# Patient Record
Sex: Female | Born: 1995 | Race: White | Hispanic: No | Marital: Single | Smoking: Never smoker
Health system: Southern US, Community
[De-identification: ages and names within clinical notes are randomized; demographics above are authoritative.]

## PROBLEM LIST (undated history)

## (undated) DIAGNOSIS — F909 Attention-deficit hyperactivity disorder, unspecified type: Secondary | ICD-10-CM

## (undated) DIAGNOSIS — R079 Chest pain, unspecified: Secondary | ICD-10-CM

## (undated) DIAGNOSIS — F32A Depression, unspecified: Secondary | ICD-10-CM

## (undated) DIAGNOSIS — G43909 Migraine, unspecified, not intractable, without status migrainosus: Secondary | ICD-10-CM

## (undated) HISTORY — DX: Chest pain, unspecified: R07.9

## (undated) HISTORY — DX: Migraine, unspecified, not intractable, without status migrainosus: G43.909

## (undated) HISTORY — DX: Depression, unspecified: F32.A

---
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
## 2020-02-08 LAB — HM PAP SMEAR: HM Pap smear: NEGATIVE

## 2020-04-14 ENCOUNTER — Encounter (HOSPITAL_BASED_OUTPATIENT_CLINIC_OR_DEPARTMENT_OTHER): Payer: Self-pay | Admitting: Emergency Medicine

## 2020-04-14 ENCOUNTER — Emergency Department (HOSPITAL_BASED_OUTPATIENT_CLINIC_OR_DEPARTMENT_OTHER): Payer: BC Managed Care – PPO

## 2020-04-14 ENCOUNTER — Other Ambulatory Visit: Payer: Self-pay

## 2020-04-14 ENCOUNTER — Emergency Department (HOSPITAL_BASED_OUTPATIENT_CLINIC_OR_DEPARTMENT_OTHER)
Admission: EM | Admit: 2020-04-14 | Discharge: 2020-04-14 | Disposition: A | Discharge status: Home / Self Care | Payer: BC Managed Care – PPO | Attending: Emergency Medicine | Admitting: Emergency Medicine

## 2020-04-14 DIAGNOSIS — G44319 Acute post-traumatic headache, not intractable: Secondary | ICD-10-CM | POA: Diagnosis not present

## 2020-04-14 DIAGNOSIS — R42 Dizziness and giddiness: Secondary | ICD-10-CM | POA: Diagnosis present

## 2020-04-14 LAB — CBC WITH DIFFERENTIAL/PLATELET
Abs Immature Granulocytes: 0.02 10*3/uL (ref 0.00–0.07)
Basophils Absolute: 0 10*3/uL (ref 0.0–0.1)
Basophils Relative: 0 %
Eosinophils Absolute: 0.1 10*3/uL (ref 0.0–0.5)
Eosinophils Relative: 2 %
HCT: 37.3 % (ref 36.0–46.0)
Hemoglobin: 12.3 g/dL (ref 12.0–15.0)
Immature Granulocytes: 0 %
Lymphocytes Relative: 24 %
Lymphs Abs: 1.4 10*3/uL (ref 0.7–4.0)
MCH: 28.3 pg (ref 26.0–34.0)
MCHC: 33 g/dL (ref 30.0–36.0)
MCV: 85.9 fL (ref 80.0–100.0)
Monocytes Absolute: 0.4 10*3/uL (ref 0.1–1.0)
Monocytes Relative: 7 %
Neutro Abs: 4 10*3/uL (ref 1.7–7.7)
Neutrophils Relative %: 67 %
Platelets: 214 10*3/uL (ref 150–400)
RBC: 4.34 MIL/uL (ref 3.87–5.11)
RDW: 13.5 % (ref 11.5–15.5)
WBC: 6 10*3/uL (ref 4.0–10.5)
nRBC: 0 % (ref 0.0–0.2)

## 2020-04-14 LAB — BASIC METABOLIC PANEL
Anion gap: 7 (ref 5–15)
BUN: 11 mg/dL (ref 6–20)
CO2: 24 mmol/L (ref 22–32)
Calcium: 8.7 mg/dL — ABNORMAL LOW (ref 8.9–10.3)
Chloride: 105 mmol/L (ref 98–111)
Creatinine, Ser: 0.78 mg/dL (ref 0.44–1.00)
GFR, Estimated: >60 mL/min (ref >60)
Glucose, Bld: 84 mg/dL (ref 70–99)
Potassium: 3.4 mmol/L — ABNORMAL LOW (ref 3.5–5.1)
Sodium: 136 mmol/L (ref 135–145)

## 2020-04-14 LAB — HCG, SERUM, QUALITATIVE: Preg, Serum: NEGATIVE

## 2020-04-14 MED ORDER — SODIUM CHLORIDE 0.9 % IV BOLUS
1000.0000 mL | Freq: Once | INTRAVENOUS | Status: AC
  Administered 2020-04-14: 1000 mL via INTRAVENOUS

## 2020-04-14 MED ORDER — MECLIZINE HCL 25 MG PO TABS
25.0000 mg | ORAL_TABLET | Freq: Once | ORAL | Status: AC
  Administered 2020-04-14: 25 mg via ORAL
  Filled 2020-04-14: qty 1

## 2020-04-14 MED ORDER — PROCHLORPERAZINE EDISYLATE 10 MG/2ML IJ SOLN
10.0000 mg | Freq: Once | INTRAMUSCULAR | Status: AC
  Administered 2020-04-14: 10 mg via INTRAVENOUS
  Filled 2020-04-14: qty 2

## 2020-04-14 MED ORDER — DIPHENHYDRAMINE HCL 50 MG/ML IJ SOLN
25.0000 mg | Freq: Once | INTRAMUSCULAR | Status: AC
  Administered 2020-04-14: 25 mg via INTRAVENOUS
  Filled 2020-04-14: qty 1

## 2020-04-14 NOTE — ED Provider Notes (Signed)
[facility] EMERGENCY DEPARTMENT Provider Note   CSN: [id number] Arrival date & time: 04/14/20  8828     History Chief Complaint  Patient presents with  . Dizziness    Kimberly Wiley is a 25 y.o. female.  54 yoF with a cc of a syncopal event.  Occurred two days ago.  Patient was at a concert, had had 1 alcoholic beverage and and suddenly felt lightheaded and collapsed to the ground.  She struck her head and her left hip.  She went home and woke up the next morning and had worsening pain to the hip and a headache that feels similar to her prior migraines.  She had persistent symptoms today and so went to urgent care who suggested she come to the ED for evaluation.  She has had some nausea with this.  Denies persistent vomiting.  Denies blood thinner use.  Denies one-sided numbness or weakness denies difficulty with speech or swallowing.  Is able to bear weight with the left leg but has pain with it.  The history is provided by the patient and a significant other.  Dizziness Quality:  Lightheadedness Severity:  Moderate Onset quality:  Gradual Duration:  2 days Timing:  Constant Progression:  Unchanged Chronicity:  New Context: standing up   Relieved by:  Nothing Worsened by:  Nothing Ineffective treatments:  None tried Associated symptoms: headaches   Associated symptoms: no chest pain, no nausea, no palpitations, no shortness of breath and no vomiting        Past Medical History:  Diagnosis Date  . ADHD (attention deficit hyperactivity disorder)     There are no problems to display for this patient.   History reviewed. No pertinent surgical history.   OB History   No obstetric history on file.     History reviewed. No pertinent family history.  Social History   Tobacco Use  . Smoking status: Never Smoker  . Smokeless tobacco: Never Used  . Tobacco comment: cbd  Substance Use Topics  . Alcohol use: Yes    Comment: once week socially  . Drug  use: No    Home Medications Prior to Admission medications   Medication Sig Start Date End Date Taking? Authorizing Provider  cephALEXin (KEFLEX) 500 MG capsule Take 1 capsule (500 mg total) by mouth 4 (four) times daily. 02/04/15   Linwood Dibbles, MD  promethazine (PHENERGAN) 25 MG tablet Take 1 tablet (25 mg total) by mouth every 6 (six) hours as needed for nausea or vomiting. 02/04/15   Linwood Dibbles, MD    Allergies    Tylenol [acetaminophen]  Review of Systems   Review of Systems  Constitutional: Negative for chills and fever.  HENT: Negative for congestion and rhinorrhea.   Eyes: Negative for redness and visual disturbance.  Respiratory: Negative for shortness of breath and wheezing.   Cardiovascular: Negative for chest pain and palpitations.  Gastrointestinal: Negative for nausea and vomiting.  Genitourinary: Negative for dysuria and urgency.  Musculoskeletal: Positive for arthralgias. Negative for myalgias.  Skin: Negative for pallor and wound.  Neurological: Positive for dizziness and headaches.    Physical Exam Updated Vital Signs BP 118/90   Pulse (!) 54   Temp 97.8 F (36.6 C) (Oral)   Resp 12   Ht 5\' 8"  (1.727 m)   Wt 58.5 kg   SpO2 100%   BMI 19.61 kg/m   Physical Exam Vitals and nursing note reviewed.  Constitutional:      General: She is  not in acute distress.    Appearance: She is well-developed. She is not diaphoretic.  HENT:     Head: Normocephalic and atraumatic.     Comments: Pain with palpation of the left temporal area.  No obvious bruising. Eyes:     Pupils: Pupils are equal, round, and reactive to light.  Cardiovascular:     Rate and Rhythm: Normal rate and regular rhythm.     Heart sounds: No murmur heard. No friction rub. No gallop.   Pulmonary:     Effort: Pulmonary effort is normal.     Breath sounds: No wheezing or rales.  Abdominal:     General: There is no distension.     Palpations: Abdomen is soft.     Tenderness: There is no  abdominal tenderness.  Musculoskeletal:        General: Tenderness present.     Cervical back: Normal range of motion and neck supple.     Comments: Mild tenderness about the ischium on the left.  No significant pain to the left hip full range of motion, no pain with internal or external rotation.  No pelvic instability.  No signs of trauma.  Skin:    General: Skin is warm and dry.  Neurological:     Mental Status: She is alert and oriented to person, place, and time.     Cranial Nerves: Cranial nerves are intact.     Sensory: Sensation is intact.     Motor: Motor function is intact.     Coordination: Coordination is intact.     Comments: Painful gait.  Otherwise benign neuro exam.  Psychiatric:        Behavior: Behavior normal.     ED Results / Procedures / Treatments   Labs (all labs ordered are listed, but only abnormal results are displayed) Labs Reviewed  BASIC METABOLIC PANEL - Abnormal; Notable for the following components:      Result Value   Potassium 3.4 (*)    Calcium 8.7 (*)    All other components within normal limits  CBC WITH DIFFERENTIAL/PLATELET  HCG, SERUM, QUALITATIVE    EKG EKG Interpretation  Date/Time:  Saturday April 14 2020 09:28:31 EDT Ventricular Rate:  66 PR Interval:  114 QRS Duration: 86 QT Interval:  417 QTC Calculation: 437 R Axis:   56 Text Interpretation: Sinus rhythm Borderline short PR interval no wpw, prolonged qt or brugada No significant change since last tracing Confirmed by Melene Plan [phone]) on 04/14/2020 9:44:03 AM   Radiology CT Head Wo Contrast  Result Date: 04/14/2020 CLINICAL DATA:  25 year old female with history of head trauma after a fall at a concert 2 days ago with injury to the left posterior head. Ataxia. EXAM: CT HEAD WITHOUT CONTRAST TECHNIQUE: Contiguous axial images were obtained from the base of the skull through the vertex without intravenous contrast. COMPARISON:  No priors. FINDINGS: Brain: No evidence of acute  infarction, hemorrhage, hydrocephalus, extra-axial collection or mass lesion/mass effect. Vascular: No hyperdense vessel or unexpected calcification. Skull: Normal. Negative for fracture or focal lesion. Sinuses/Orbits: No acute finding. Other: None. IMPRESSION: 1. No evidence of significant acute traumatic injury to the skull or brain. The appearance of the brain is normal. Electronically Signed   By: Trudie Reed M.D.   On: 04/14/2020 11:18    Procedures Procedures   Medications Ordered in ED Medications  meclizine (ANTIVERT) tablet 25 mg (25 mg Oral Given 04/14/20 0951)  sodium chloride 0.9 % bolus 1,000 mL (1,000 mLs  Intravenous New Bag/Given 04/14/20 1020)  prochlorperazine (COMPAZINE) injection 10 mg (10 mg Intravenous Given 04/14/20 1022)  diphenhydrAMINE (BENADRYL) injection 25 mg (25 mg Intravenous Given 04/14/20 1020)    ED Course  I have reviewed the triage vital signs and the nursing notes.  Pertinent labs & imaging results that were available during my care of the patient were reviewed by me and considered in my medical decision making (see chart for details).    MDM Rules/Calculators/A&P                          25 yo F with a chief complaint of a fall.  This occurred a couple days ago.  The patient was at a rock concerts and felt unwell and then collapsed to the ground.  Since then has been dizzy and having left-sided headaches.  She has a benign neurologic exam here.  I feel the patient is unlikely to have a significant intracranial hemorrhage if she is greater than 48 hours after the event, much more likely she has a concussion.  I discussed with her different possible evaluations including CT imaging and blood work.  Headache cocktail as this feels like her prior migraines which could have been activated by the trauma.  Agrees to blood work, reassess.     Blood work without anemia.  Mild hypokalemia otherwise no electrolyte abnormality.  CT scan of the head is negative for  acute intracranial pathology.  On reassessment the patient is feeling much better.  Will discharge home.  PCP follow-up.  11:29 AM:  I have discussed the diagnosis/risks/treatment options with the patient and believe the pt to be eligible for discharge home to follow-up with PCP. We also discussed returning to the ED immediately if new or worsening sx occur. We discussed the sx which are most concerning (e.g., sudden worsening pain, fever, inability to tolerate by mouth) that necessitate immediate return. Medications administered to the patient during their visit and any new prescriptions provided to the patient are listed below.  Medications given during this visit Medications  meclizine (ANTIVERT) tablet 25 mg (25 mg Oral Given 04/14/20 0951)  sodium chloride 0.9 % bolus 1,000 mL (1,000 mLs Intravenous New Bag/Given 04/14/20 1020)  prochlorperazine (COMPAZINE) injection 10 mg (10 mg Intravenous Given 04/14/20 1022)  diphenhydrAMINE (BENADRYL) injection 25 mg (25 mg Intravenous Given 04/14/20 1020)     The patient appears reasonably screen and/or stabilized for discharge and I doubt any other medical condition or other [facility] requiring further screening, evaluation, or treatment in the ED at this time prior to discharge.   Final Clinical Impression(s) / ED Diagnoses Final diagnoses:  Acute post-traumatic headache, not intractable    Rx / DC Orders ED Discharge Orders    None       Melene Plan, DO 04/14/20 1129

## 2020-04-14 NOTE — ED Notes (Signed)
Patient transported to CT 

## 2020-04-14 NOTE — Discharge Instructions (Signed)
Follow up with your doctor in the office.  Return for worsening headache or dizziness.

## 2020-04-14 NOTE — ED Triage Notes (Signed)
Pt passed out at event Thursday night, hit left hip and head. C/o headache,dizziness.

## 2022-06-17 IMAGING — CT CT HEAD W/O CM
3 series · 15 of 47 positions shown, 18 images · non-contrast
Comparison: No priors.

CLINICAL DATA: 24-year-old female with history of head trauma after
a fall at a concert 2 days ago with injury to the left posterior
head. Ataxia.

EXAM:
CT HEAD WITHOUT CONTRAST
TECHNIQUE: Contiguous axial images were obtained from the base of the skull
through the vertex without intravenous contrast.

[Series 2: head wo · axial · 0.43mm/px · z∈[-173,-48]mm · 9 of 30 slices shown, 12 images]
[im 3/30  brain]
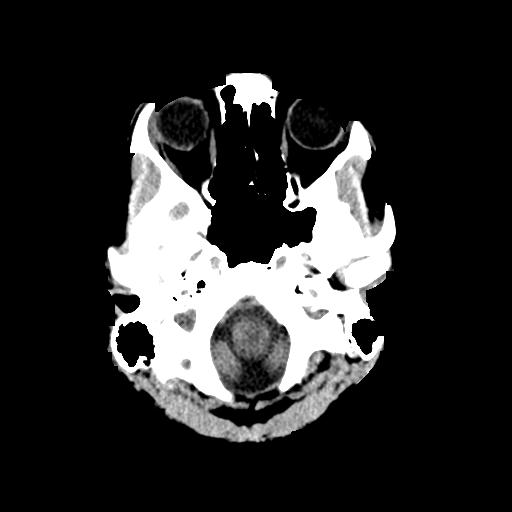
[im 3/30  bone]
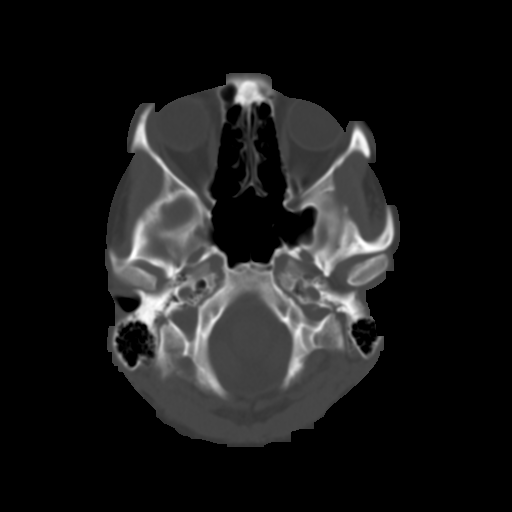
[im 6/30  brain]
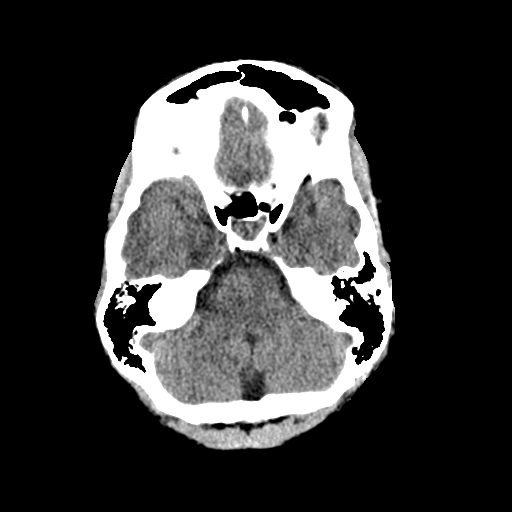
[im 9/30  brain]
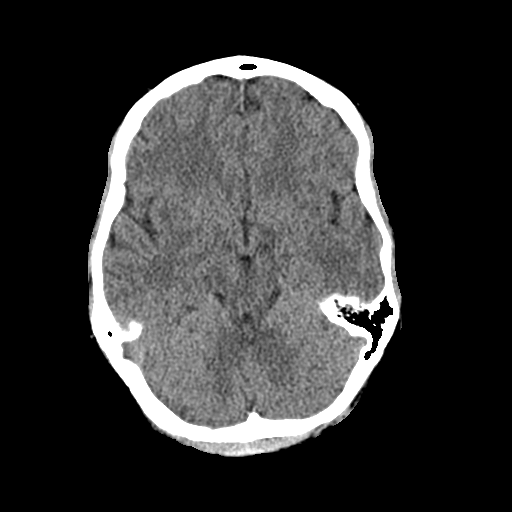
[im 12/30  brain]
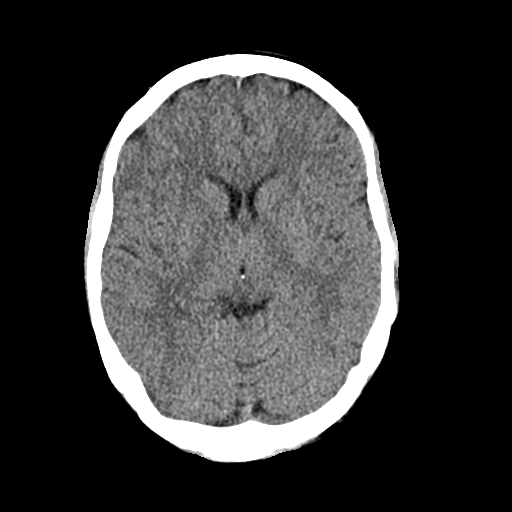
[im 16/30  brain]
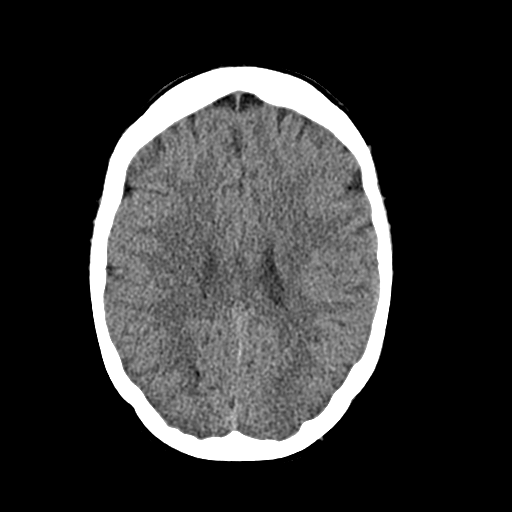
[im 16/30  bone]
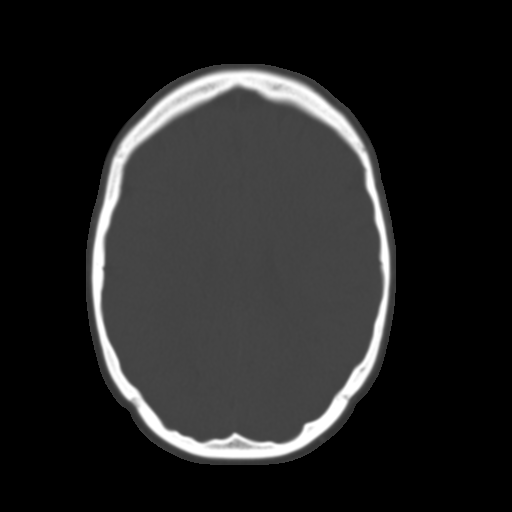
[im 19/30  brain]
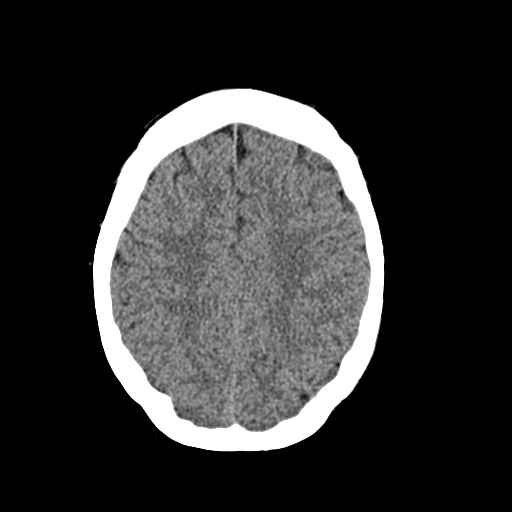
[im 22/30  brain]
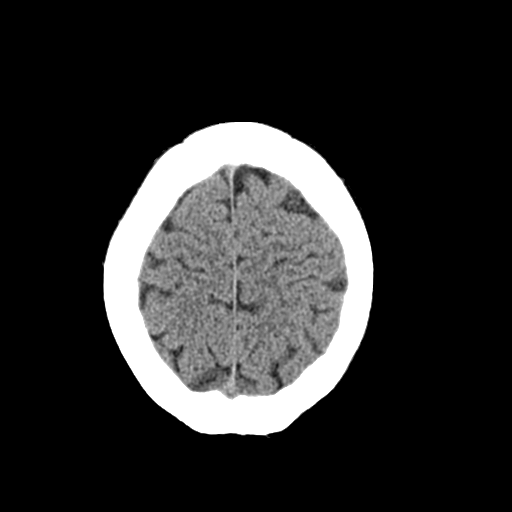
[im 25/30  brain]
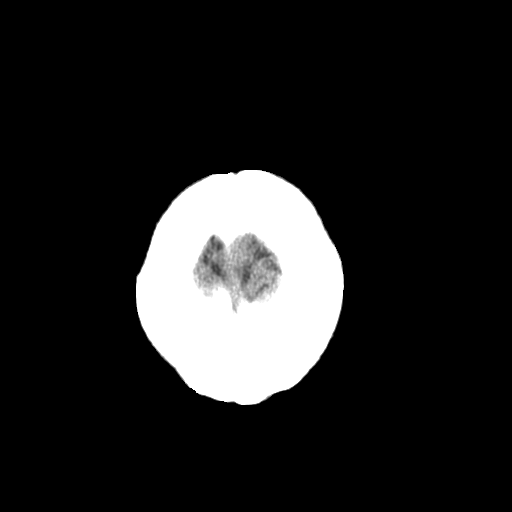
[im 28/30  brain]
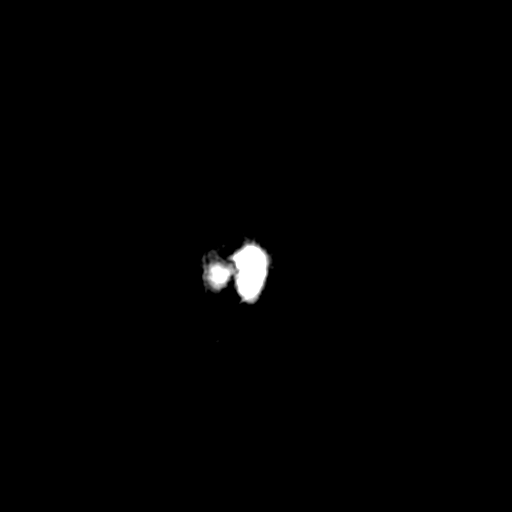
[im 28/30  bone]
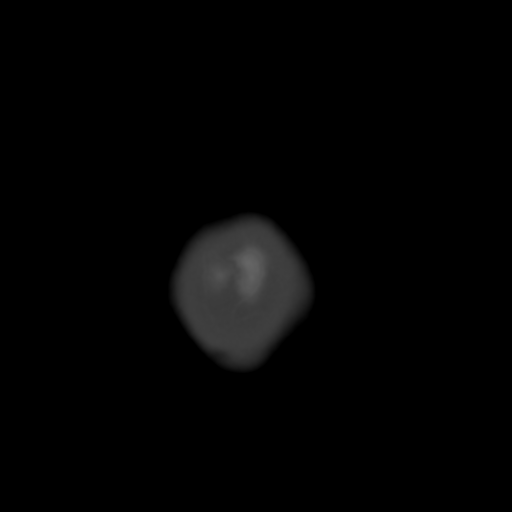

[Series 4: cor soft · coronal · 0.31mm/px · 3 of 68 slices shown]
[im 23/68  brain]
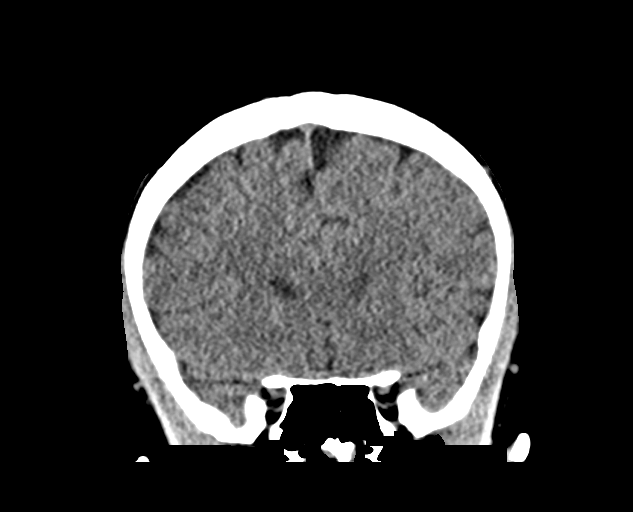
[im 30/68  brain]
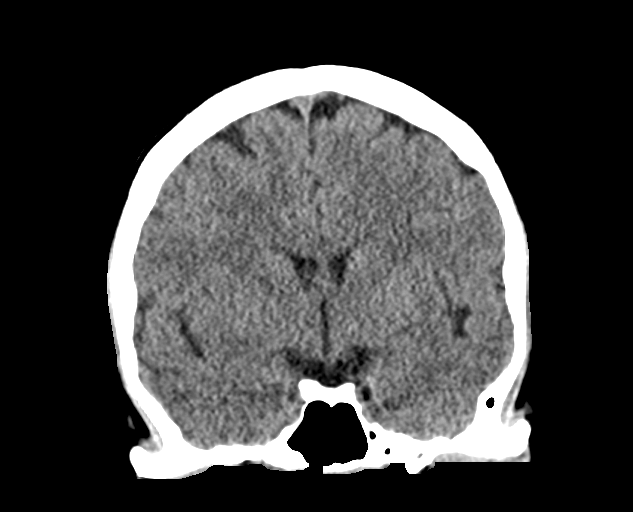
[im 38/68  brain]
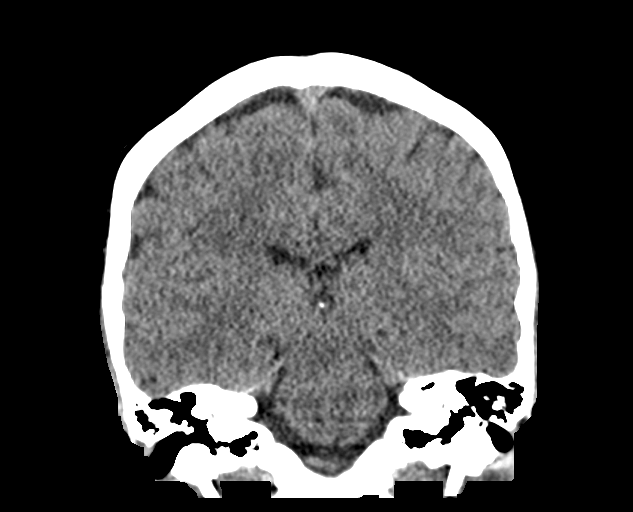

[Series 5: sag soft · sagittal · 0.31mm/px · 3 of 52 slices shown]
[im 18/52  brain]
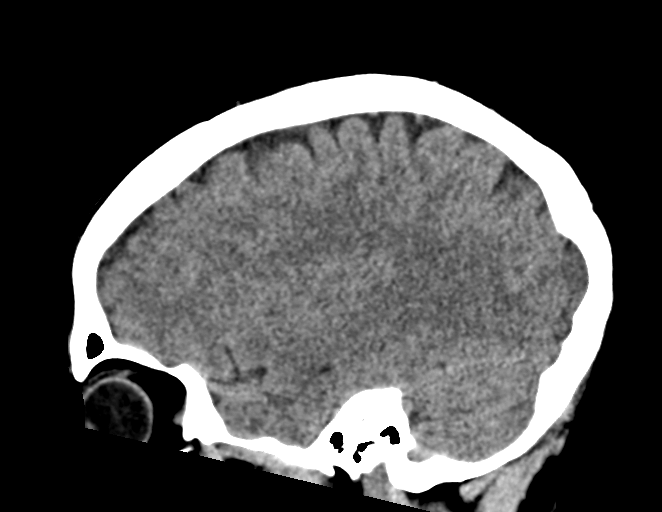
[im 26/52  brain]
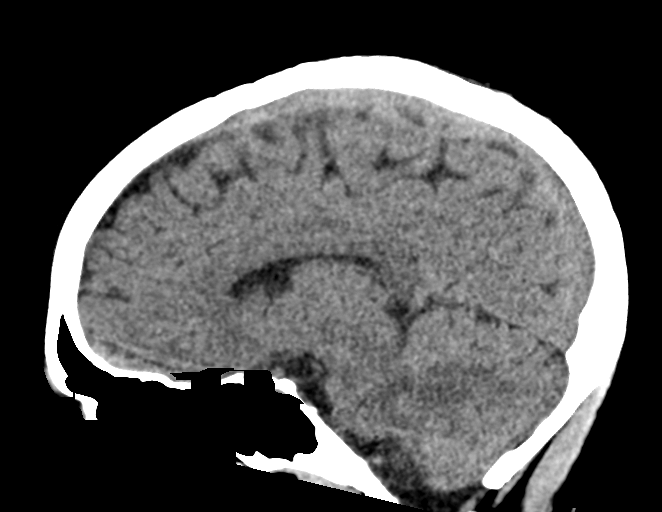
[im 35/52  brain]
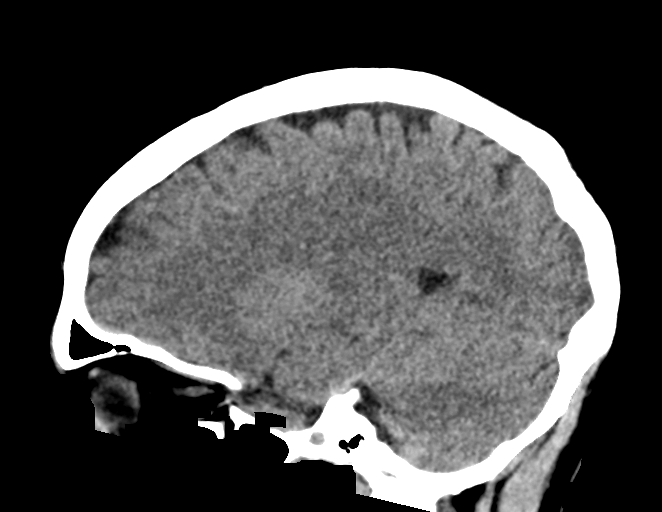

[15 of 47 positions shown; findings below may reference images not displayed]

FINDINGS: Brain: No evidence of acute infarction, hemorrhage, hydrocephalus,
extra-axial collection or mass lesion/mass effect.

Vascular: No hyperdense vessel or unexpected calcification.

Skull: Normal. Negative for fracture or focal lesion.

Sinuses/Orbits: No acute finding.

Other: None.
IMPRESSION: 1. No evidence of significant acute traumatic injury to the skull or
brain. The appearance of the brain is normal.

## 2023-02-20 ENCOUNTER — Emergency Department (HOSPITAL_COMMUNITY): Payer: Worker's Compensation

## 2023-02-20 ENCOUNTER — Encounter (HOSPITAL_COMMUNITY): Payer: Self-pay

## 2023-02-20 ENCOUNTER — Emergency Department (HOSPITAL_COMMUNITY)
Admission: EM | Admit: 2023-02-20 | Discharge: 2023-02-20 | Disposition: A | Discharge status: Home / Self Care | Payer: Worker's Compensation | Attending: Emergency Medicine | Admitting: Emergency Medicine

## 2023-02-20 ENCOUNTER — Other Ambulatory Visit: Payer: Self-pay

## 2023-02-20 DIAGNOSIS — W1840XA Slipping, tripping and stumbling without falling, unspecified, initial encounter: Secondary | ICD-10-CM | POA: Diagnosis not present

## 2023-02-20 DIAGNOSIS — Y990 Civilian activity done for income or pay: Secondary | ICD-10-CM | POA: Diagnosis not present

## 2023-02-20 DIAGNOSIS — S99911A Unspecified injury of right ankle, initial encounter: Secondary | ICD-10-CM | POA: Diagnosis present

## 2023-02-20 DIAGNOSIS — S93401A Sprain of unspecified ligament of right ankle, initial encounter: Secondary | ICD-10-CM | POA: Insufficient documentation

## 2023-02-20 NOTE — ED Provider Notes (Signed)
[place] EMERGENCY DEPARTMENT AT [facility] Provider Note   CSN: [id number] Arrival date & time: 02/20/23  2127     History  Chief Complaint  Patient presents with   Ankle Pain    Kimberly Wiley is a 28 y.o. female history of ADHD presented for right ankle pain after twisting her ankle at work.  Patient slipped on water and twisted her ankle.  Patient denies hitting her head or neck but states that her right ankle does hurt to move.  Patient be able to walk however it hurts to bear weight.  Patient able to move toes and denies any deformities or skin color changes.  Patient not take any medications for this.  Home Medications Prior to Admission medications   Medication Sig Start Date End Date Taking? Authorizing Provider  cephALEXin (KEFLEX) 500 MG capsule Take 1 capsule (500 mg total) by mouth 4 (four) times daily. 02/04/15   Linwood Dibbles, MD  promethazine (PHENERGAN) 25 MG tablet Take 1 tablet (25 mg total) by mouth every 6 (six) hours as needed for nausea or vomiting. 02/04/15   Linwood Dibbles, MD      Allergies    Tylenol [acetaminophen]    Review of Systems   Review of Systems  Physical Exam Updated Vital Signs BP 115/79 (BP Location: Left Arm)   Pulse 69   Temp 97.9 F (36.6 C) (Oral)   Resp 16   Ht 5\' 8"  (1.727 m)   Wt 58.5 kg   SpO2 100%   BMI 19.61 kg/m  Physical Exam Vitals reviewed.  Constitutional:      General: She is not in acute distress. Cardiovascular:     Rate and Rhythm: Normal rate.     Pulses: Normal pulses.  Musculoskeletal:     Comments: Right ankle: Negative anterior drawer test, limited range of motion due to pain, no bony tenderness abnormalities palpated, Achilles intact Able to bear weight Pain not out of proportion Soft compartments  Skin:    General: Skin is warm and dry.     Capillary Refill: Capillary refill takes less than 2 seconds.  Neurological:     Mental Status: She is alert.     Comments: Sensation intact  distally  Psychiatric:        Mood and Affect: Mood normal.     ED Results / Procedures / Treatments   Labs (all labs ordered are listed, but only abnormal results are displayed) Labs Reviewed - No data to display  EKG None  Radiology DG Ankle Complete Right Result Date: 02/20/2023 CLINICAL DATA:  fall EXAM: RIGHT ANKLE - COMPLETE 3+ VIEW COMPARISON:  None Available. FINDINGS: There is no evidence of fracture, dislocation, or joint effusion. There is no evidence of arthropathy or other focal bone abnormality. Soft tissues are unremarkable. IMPRESSION: Negative. Electronically Signed   By: Tish Frederickson M.D.   On: 02/20/2023 22:51    Procedures Procedures    Medications Ordered in ED Medications - No data to display  ED Course/ Medical Decision Making/ A&P                                 Medical Decision Making Amount and/or Complexity of Data Reviewed Radiology: ordered.   [name] 28 y.o. presented today for right ankle pain. Working DDx that I considered at this time includes, but not limited to, contusion, strain/sprain, fracture, dislocation, neurovascular compromise, septic joint,  ischemic limb, compartment syndrome.  R/o DDx: fracture, dislocation, neurovascular compromise, septic joint, ischemic limb, compartment syndrome: These are considered less likely due to history of present illness, physical exam, labs/imaging findings.  Review of prior external notes: 04/14/2020 ED  Unique Tests and My Independent Interpretation:  Right ankle x-ray: No acute pathology  Social Determinants of Health: none  Discussion with Independent Historian:  Husband  Discussion of Management of Tests: None  Risk: Low: based on diagnostic testing/clinical impression and treatment plan  Risk Stratification Score: None  Plan: On exam patient was no acute distress stable vitals.  Patient's exam was ultimately reassuring she is neuro vastly intact.  Patient was able to  bear weight as well.  I offered an ankle brace/boot and crutches however patient declined and states she wants to get this at [facility] and follow-up with her primary care provider which is reasonable.  I offered Tylenol ibuprofen and patient declined she states she has this at home.  Will discharge with work note.  Patient was given return precautions. Patient stable for discharge at this time.  Patient verbalized understanding of plan.  This chart was dictated using voice recognition software.  Despite best efforts to proofread,  errors can occur which can change the documentation meaning.         Final Clinical Impression(s) / ED Diagnoses Final diagnoses:  Sprain of right ankle, unspecified ligament, initial encounter    Rx / DC Orders ED Discharge Orders     None         Remi Deter 02/20/23 2332    Benjiman Core, MD 02/20/23 [phone]

## 2023-02-20 NOTE — ED Triage Notes (Signed)
Pt reports with right ankle pain after a fall today at work.

## 2023-02-20 NOTE — Discharge Instructions (Signed)
Please follow-up with your primary care provider in regards to recent ER visit. Today physical exam and imaging was reassuring and you most likely have a benign process going on. You may rest, ice, compress, elevate your extremity and use Tylenol or ibuprofen every 6 hours as needed for pain. If you begin to have decreased sensation, skin color changes, pain out of proportion/not controlled by over-the-counter medications, rigid compartments, or other worsening of symptoms please return to ER.

## 2023-07-14 DIAGNOSIS — F4322 Adjustment disorder with anxiety: Secondary | ICD-10-CM | POA: Diagnosis not present

## 2023-07-28 DIAGNOSIS — F4322 Adjustment disorder with anxiety: Secondary | ICD-10-CM | POA: Diagnosis not present

## 2023-08-11 DIAGNOSIS — F4322 Adjustment disorder with anxiety: Secondary | ICD-10-CM | POA: Diagnosis not present

## 2023-09-03 ENCOUNTER — Ambulatory Visit: Admitting: Family Medicine

## 2023-09-08 ENCOUNTER — Ambulatory Visit: Admitting: Family Medicine

## 2023-09-09 DIAGNOSIS — F4322 Adjustment disorder with anxiety: Secondary | ICD-10-CM | POA: Diagnosis not present

## 2023-09-10 ENCOUNTER — Ambulatory Visit: Attending: Family Medicine

## 2023-09-10 ENCOUNTER — Encounter: Payer: Self-pay | Admitting: Family Medicine

## 2023-09-10 ENCOUNTER — Ambulatory Visit: Admitting: Family Medicine

## 2023-09-10 VITALS — BP 108/72 | HR 69 | Temp 98.0°F | Ht 68.0 in | Wt 135.0 lb

## 2023-09-10 DIAGNOSIS — R079 Chest pain, unspecified: Secondary | ICD-10-CM

## 2023-09-10 DIAGNOSIS — Z7689 Persons encountering health services in other specified circumstances: Secondary | ICD-10-CM

## 2023-09-10 DIAGNOSIS — Z6820 Body mass index (BMI) 20.0-20.9, adult: Secondary | ICD-10-CM | POA: Diagnosis not present

## 2023-09-10 DIAGNOSIS — R55 Syncope and collapse: Secondary | ICD-10-CM

## 2023-09-10 DIAGNOSIS — F909 Attention-deficit hyperactivity disorder, unspecified type: Secondary | ICD-10-CM | POA: Insufficient documentation

## 2023-09-10 NOTE — Progress Notes (Unsigned)
 EP to read.

## 2023-09-10 NOTE — Progress Notes (Signed)
 New Patient Office Visit  Subjective   Patient ID: Kimberly Wiley, female    DOB: 1995-07-05  Age: 28 y.o. MRN: [id number]  CC:  Chief Complaint  Patient presents with   Establish Care    HPI [name] presents to establish care with new provider.  Patients previous primary care provider: [facility]-last seen in June 2024.   Specialist: [facility] for counseling  [facility]  Patient is concerned about having several symptoms that could be POTS or dysautonomia. She has had the following symptoms: syncopal episodes, unregulated temperature control, pain that can cause her to pass out, insomnia, urinary urgency, and eating urgency. Use to have syncopal episodes occur multiple times a day, several times a week. But, reports she has learned her triggers and will help to prevent them. Will try to drink electrolyte fluids or change temperature when triggers occur. Started 2 years ago. Last syncopal episode was 1 week ago.   She reports sometimes she will get a pin point mid sternal chest pain that last between seconds to minutes. Sharp, jabbing pain. Does not radiate. Last occurred 1 weeks ago. Also, will have SHOB that does not usually occur with the chest pain. Headaches usually occur twice a week. Thinks she has palpitations with change in heart rate based on her watch.   Based on chart review after visit, she was seen at [facility] for syncope and seizure like activity. She was referred to neurology. No neurology appointment is in Epic. Recommend to have basic lab work, but declined due to scared of needles. This also occurred today. She reports she was not prepared to have labs drawn.   Outpatient Encounter Medications as of 09/10/2023  Medication Sig   norelgestromin-ethinyl estradiol (XULANE) 150-35 MCG/24HR transdermal patch Place 1 patch onto the skin once a week.   [DISCONTINUED] cephALEXin   (KEFLEX ) 500 MG capsule Take 1 capsule (500 mg total) by mouth 4 (four) times daily.   [DISCONTINUED] promethazine  (PHENERGAN ) 25 MG tablet Take 1 tablet (25 mg total) by mouth every 6 (six) hours as needed for nausea or vomiting.   No facility-administered encounter medications on file as of 09/10/2023.    Past Medical History:  Diagnosis Date   ADHD (attention deficit hyperactivity disorder)    Depression    Migraines     History reviewed. No pertinent surgical history.  Family History  Problem Relation Age of Onset   Miscarriages / [place] Mother    Other Sister        dysautonomia   Other Maternal Grandmother        POTS    Social History   Socioeconomic History   Marital status: Single    Spouse name: Not on file   Number of children: 0   Years of education: Not on file   Highest education level: Associate degree: occupational, Scientist, product/process development, or vocational program  Occupational History   Not on file  Tobacco Use   Smoking status: Never   Smokeless tobacco: Never   Tobacco comments:    cbd  Vaping Use   Vaping status: Never Used  Substance and Sexual Activity   Alcohol use: Yes    Comment: once week socially   Drug use: No   Sexual activity: Yes    Birth control/protection: Patch  Other Topics Concern   Not on file  Social History Narrative   Not on file   Social Drivers of Health  Financial Resource Strain: Low Risk  (06/11/2022)   [facility]   Overall Financial Resource Strain (CARDIA)    Difficulty of Paying Living Expenses: Not very hard  Food Insecurity: No Food Insecurity (06/11/2022)   Received from [facility]   Hunger Vital Sign    Within the past 12 months, you worried that your food would run out before you got the money to buy more.: Never true    Within the past 12 months, the food you bought just didn't last and you didn't have money to get more.: Never true  Transportation Needs: No Transportation Needs (06/11/2022)    Received from [facility] - Transportation    Lack of Transportation (Medical): No    Lack of Transportation (Non-Medical): No  Physical Activity: Sufficiently Active (06/11/2022)   Received from [facility]   Exercise Vital Sign    On average, how many days per week do you engage in moderate to strenuous exercise (like a brisk walk)?: 3 days    On average, how many minutes do you engage in exercise at this level?: 70 min  Stress: Stress Concern Present (06/11/2022)   Received from [facility] Occupational [facility] - Occupational Stress Questionnaire    Feeling of Stress : Rather much  Social Connections: Socially Integrated (06/11/2022)   Received from [facility]   Social Network    How would you rate your social network (family, work, friends)?: Good participation with social networks  Intimate Partner Violence: Not At Risk (06/11/2022)   Received from [facility]   HITS    Over the last 12 months how often did your partner physically hurt you?: Never    Over the last 12 months how often did your partner insult you or talk down to you?: Never    Over the last 12 months how often did your partner threaten you with physical harm?: Never    Over the last 12 months how often did your partner scream or curse at you?: Never    ROS See HPI above    Objective  BP 108/72   Pulse 69   Temp 98 F (36.7 C) (Oral)   Ht 5' 8 (1.727 m)   Wt 135 lb (61.2 kg)   LMP 08/16/2023 (Approximate)   SpO2 96%   BMI 20.53 kg/m   Physical Exam Vitals reviewed.  Constitutional:      General: She is not in acute distress.    Appearance: Normal appearance. She is normal weight. She is not ill-appearing, toxic-appearing or diaphoretic.  HENT:     Head: Atraumatic.  Eyes:     General:        Right eye: No discharge.        Left eye: No discharge.     Extraocular Movements:     Right eye: Normal extraocular motion and no nystagmus.     Left eye: Normal  extraocular motion and no nystagmus.     Conjunctiva/sclera: Conjunctivae normal.     Pupils: Pupils are equal, round, and reactive to light.  Cardiovascular:     Rate and Rhythm: Normal rate and regular rhythm.     Heart sounds: Normal heart sounds. No murmur heard.    No friction rub. No gallop.  Pulmonary:     Effort: Pulmonary effort is normal. No respiratory distress.     Breath sounds: Normal breath sounds.  Musculoskeletal:        General: Normal  range of motion.     Right lower leg: No edema.     Left lower leg: No edema.  Skin:    General: Skin is warm and dry.  Neurological:     General: No focal deficit present.     Mental Status: She is alert and oriented to person, place, and time. Mental status is at baseline.     Cranial Nerves: Cranial nerves 2-12 are intact.     Motor: No weakness.  Psychiatric:        Mood and Affect: Mood is anxious.        Behavior: Behavior normal.        Thought Content: Thought content normal.        Judgment: Judgment normal.   EKG for syncopal episodes and chest pain. Sinus brady, HR 58 with no ST elevation. No acute changes from previous EKG on 04/16/2020.     Assessment & Plan:  Chest pain, unspecified type -     EKG 12-Lead -     CBC with Differential/Platelet; Future -     Comprehensive metabolic panel with GFR; Future -     Lipid panel; Future -     Magnesium; Future -     TSH; Future -     LONG TERM MONITOR (3-14 DAYS); Future -     Ambulatory referral to Cardiology  BMI 20.0-20.9, adult -     CBC with Differential/Platelet; Future -     Comprehensive metabolic panel with GFR; Future -     Lipid panel; Future -     Magnesium; Future -     TSH; Future  Syncope, unspecified syncope type -     EKG 12-Lead -     LONG TERM MONITOR (3-14 DAYS); Future -     Ambulatory referral to Cardiology  Encounter to establish care  1.Review health maintenance:  -Cervical cancer screening: 1-2 years ago; has an appointment in 1  month; will request records -Covid booster: Declines  -Tdap vaccine: Declines  -HIV and Hep C screening: Declines -Influenza vaccine: Declines  2.Ordered labs (CBC, CMP, Lipid panel, Magnesium, and TSH) . Please schedule an appointment when prepared to have labs drawn and able to fast. Office will call with lab results and will be available via MyChart.  3.EKG completed today for syncopal episode and chest. No acute findings.  4.Ordered portable heart monitor for chest pain and syncopal episode. Discussed about the process of the monitor.  5.Placed a referral to cardiology for chest pain and syncopal episode. 6.Follow up in 3-4 weeks to discuss further concerns.  Return for follow-up 3-4 weeks; lab appointment .   Jirah Rider, NP

## 2023-09-10 NOTE — Patient Instructions (Signed)
-  It was nice to meet you and look forward to taking care of you.  -Ordered labs. Please schedule an appointment when prepared and able to fast. Office will call with lab results and will be available via MyChart.  -EKG completed today for syncopal episode and chest. No acute findings.  -Ordered portable heart monitor for chest pain and syncopal episode. Discussed about the process of the monitor.  -Placed a referral to cardiology for chest pain and syncopal episode. Please call the office or send a MyChart message if you do not receive a phone call or a MyChart message about appointment in 2 weeks.  -Follow up in 3-4 weeks to discuss further concerns.

## 2023-09-14 NOTE — Progress Notes (Unsigned)
 OFFICE NOTE:    Date:  09/15/2023  ID:  Kimberly Wiley, DOB 08/19/1995, MRN [id number] PCP: Kimberly Philippe SAUNDERS, NP  [facility] Providers Cardiologist:  None        Syncope  ADHD Depression Migraine HAs        Discussed the use of AI scribe software for clinical note transcription with the patient, who gave verbal consent to proceed. History of Present Illness Kimberly Wiley is a 28 y.o. female who is referred by Kimberly Philippe SAUNDERS, NP for evaluation of chest pain, syncope. Her PCP ordered an event monitor. Results are pending. Of note, pt was seen at [facility] in 06/2022 for syncope and seizure-like activity. Pt was referred to Neurology but was never seen by Neuro.    She has experienced worsening palpitations for the past two years, describing them as episodes where she 'almost can't breathe.' Her heart rate fluctuates significantly, ranging from 39 to 210 beats per minute. These episodes last from 30 seconds to five minutes and are sometimes accompanied by a sensation of her heart skipping beats. She has had multiple episodes of syncope, with four to five occurrences in the past year. These episodes occur in various positions, including sitting, standing, and lying down, and are not consistently triggered by specific activities or stressors. She recalls an episode where she felt an intense heat sensation before passing out while on a bus in the [place]. She exercises regularly. She sometimes experiences chest pain during exercise, which can be dull and tapping, and has occasionally had to stop exercising due to the intensity of the pain.   FHx: Her family history includes a sister diagnosed with dysautonomia and a mother suspected of having dysautonomia or POTS. There is also a history of heart attacks among older family members.  SHx: She consumes alcohol infrequently, about once a month, and has never smoked or used drugs.    ROS-See HPI She reports a  cough as she is getting over a cold. No fever, chills, or difficulty breathing when lying flat.    Studies Reviewed:      EKG dated 10-01-2023 personally reviewed and interpreted by me today 09/15/2023  Sinus brady, HR 58, QTc 403, no acute ST-TW changes, no pre-excitation  Results         Physical Exam:  VS:  BP 118/76 (BP Location: Left Arm, Patient Position: Sitting)   Pulse 80   Ht 5' 8 (1.727 m)   Wt 134 lb 12.8 oz (61.1 kg)   LMP 08/16/2023 (Approximate)   SpO2 99%   BMI 20.50 kg/m     Orthostatic VS for the past 24 hrs (Last 3 readings):  BP- Lying Pulse- Lying BP- Sitting Pulse- Sitting BP- Standing at 0 minutes Pulse- Standing at 0 minutes BP- Standing at 3 minutes Pulse- Standing at 3 minutes  09/15/23 0852 111/73 79 117/82 77 116/80 81 116/82 87    Wt Readings from Last 3 Encounters:  09/15/23 134 lb 12.8 oz (61.1 kg)  10-01-2023 135 lb (61.2 kg)  02/20/23 128 lb 15.5 oz (58.5 kg)    Constitutional:      Appearance: Healthy appearance. Not in distress.  Neck:     Vascular: No carotid bruit. JVD normal.  Pulmonary:     Breath sounds: Normal breath sounds. No wheezing. No rales.  Cardiovascular:     Normal rate. Regular rhythm.     Murmurs: There is no murmur.  Edema:    Peripheral edema  absent.  Abdominal:     Palpations: Abdomen is soft.       Assessment and Plan:    Assessment & Plan Syncope and collapse Precordial chest pain Palpitations She has had recurrent episodes of syncope and palpitations over several years.  It has been worse over the past 2 years.  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  A lot of her symptoms seem to be related to postural changes.  However, she has also had episodes of syncope while in a seated position.  She has had exertional symptoms as well.  She notes some chest discomfort with exertion.  However, this sounds noncardiac. She is young and does not have significant RFs for  ischemic heart disease. She has recorded HRs in the low 200s on her fitness watch in the past. She typically does have a prodrome prior to syncope.  Given her symptoms, POTS is certainly a potential diagnosis.  However, today, her heart rate did not change significantly with standing and her blood pressure remained stable.  Primary care has ordered an event monitor which has not yet been started.  Recent EKG demonstrated normal sinus rhythm.  There was no evidence of preexcitation or QT prolongation. - Recommend conservative measures including pushing fluids, electrolytes, liberalization of salt, compression stockings - Recommend she try to track blood pressures lying, sitting, standing at home - Complete lab work recommended by primary care including CMET, CBC, TSH - Obtain echocardiogram to rule out structural heart disease - Arrange exercise treadmill test to rule out exercise-induced arrhythmias, ischemia - Follow-up 3 to 4 months - If response to conservative measures is not adequate, we could trial a small dose of beta-blocker.  - If syncope continues without significant abnormalities on workup, consider referral to EP for ILR - If diagnosis seems to be POTS and symptoms continue despite conservative management, consider referral to [facility] (Dr. Lavern Wiley). - Follow-up 3-4 months      Informed Consent   Shared Decision Making/Informed Consent The risks [chest pain, shortness of breath, cardiac arrhythmias, dizziness, blood pressure fluctuations, myocardial infarction, stroke/transient ischemic attack, and life-threatening complications (estimated to be 1 in 10,000)], benefits (risk stratification, diagnosing coronary artery disease, treatment guidance) and alternatives of an exercise tolerance test were discussed in detail with Kimberly Wiley and she agrees to proceed.     Dispo:  Return in about 4 months (around 01/15/2024) for Routine Follow Up, w/ Glendia Ferrier,  PA-C.  Signed, Glendia Ferrier, PA-C

## 2023-09-15 ENCOUNTER — Ambulatory Visit: Attending: Physician Assistant | Admitting: Physician Assistant

## 2023-09-15 ENCOUNTER — Encounter: Payer: Self-pay | Admitting: Physician Assistant

## 2023-09-15 VITALS — BP 118/76 | HR 80 | Ht 68.0 in | Wt 134.8 lb

## 2023-09-15 DIAGNOSIS — R002 Palpitations: Secondary | ICD-10-CM | POA: Diagnosis not present

## 2023-09-15 DIAGNOSIS — R072 Precordial pain: Secondary | ICD-10-CM | POA: Diagnosis not present

## 2023-09-15 DIAGNOSIS — R55 Syncope and collapse: Secondary | ICD-10-CM

## 2023-09-15 NOTE — Patient Instructions (Signed)
 Medication Instructions:  Your physician recommends that you continue on your current medications as directed. Please refer to the Current Medication list given to you today.  *If you need a refill on your cardiac medications before your next appointment, please call your pharmacy*  Lab Work: None ordered today  If you have labs (blood work) drawn today and your tests are completely normal, you will receive your results only by: MyChart Message (if you have MyChart) OR A paper copy in the mail If you have any lab test that is abnormal or we need to change your treatment, we will call you to review the results.  Testing/Procedures: Your physician has requested that you have an exercise tolerance test. For further information please visit [email or web address] Please also follow instruction sheet, BELOW:   - DO NOT EAT, DRINK, OR USE TOBACCO PRODUCTS WITHIN 4 HOURS OF THE TEST - DRESS PREPARED TO EXERCISE IN A COMFORTABLE, 2 PIECE CLOTHING OUTFIT AND WALKING SHOES - BRING ANY PRESCRIPTION MEDICATIONS WITH YOU  - NOTIFY THE OFFICE 24 HOURS IN ADVANCE IF YOU NEED TO CANCEL - CALL THE OFFICE [phone] IF YOU HAVE ANY QUESTIONS  Your physician has requested that you have an echocardiogram. Echocardiography is a painless test that uses sound waves to create images of your heart. It provides your doctor with information about the size and shape of your heart and how well your heart's chambers and valves are working. This procedure takes approximately one hour. There are no restrictions for this procedure. Please do NOT wear cologne, perfume, aftershave, or lotions (deodorant is allowed). Please arrive 15 minutes prior to your appointment time.  Please note: We ask at that you not bring children with you during ultrasound (echo/ vascular) testing. Due to room size and safety concerns, children are not allowed in the ultrasound rooms during exams. Our front office staff cannot provide observation  of children in our lobby area while testing is being conducted. An adult accompanying a patient to their appointment will only be allowed in the ultrasound room at the discretion of the ultrasound technician under special circumstances. We apologize for any inconvenience.  Follow-Up: At [facility], you and your health needs are our priority.  As part of our continuing mission to provide you with exceptional heart care, our providers are all part of one team.  This team includes your primary Cardiologist (physician) and Advanced Practice Providers or APPs (Physician Assistants and Nurse Practitioners) who all work together to provide you with the care you need, when you need it.  Your next appointment:   4 month(s)  Provider:   Glendia Ferrier, PA-C          We recommend signing up for the patient portal called MyChart.  Sign up information is provided on this After Visit Summary.  MyChart is used to connect with patients for Virtual Visits (Telemedicine).  Patients are able to view lab/test results, encounter notes, upcoming appointments, etc.  Non-urgent messages can be sent to your provider as well.   To learn more about what you can do with MyChart, go to [email or web address].   Other Instructions

## 2023-09-21 ENCOUNTER — Ambulatory Visit: Payer: Self-pay | Admitting: Family Medicine

## 2023-09-21 ENCOUNTER — Other Ambulatory Visit (INDEPENDENT_AMBULATORY_CARE_PROVIDER_SITE_OTHER)

## 2023-09-21 DIAGNOSIS — R079 Chest pain, unspecified: Secondary | ICD-10-CM

## 2023-09-21 DIAGNOSIS — Z6820 Body mass index (BMI) 20.0-20.9, adult: Secondary | ICD-10-CM | POA: Diagnosis not present

## 2023-09-21 LAB — CBC WITH DIFFERENTIAL/PLATELET
Basophils Absolute: 0 K/uL (ref 0.0–0.1)
Basophils Relative: 0.3 % (ref 0.0–3.0)
Eosinophils Absolute: 0.2 K/uL (ref 0.0–0.7)
Eosinophils Relative: 3.8 % (ref 0.0–5.0)
HCT: 38.1 % (ref 36.0–46.0)
Hemoglobin: 12.3 g/dL (ref 12.0–15.0)
Lymphocytes Relative: 45.5 % (ref 12.0–46.0)
Lymphs Abs: 2.1 K/uL (ref 0.7–4.0)
MCHC: 32.4 g/dL (ref 30.0–36.0)
MCV: 84.6 fl (ref 78.0–100.0)
Monocytes Absolute: 0.5 K/uL (ref 0.1–1.0)
Monocytes Relative: 10.7 % (ref 3.0–12.0)
Neutro Abs: 1.8 K/uL (ref 1.4–7.7)
Neutrophils Relative %: 39.7 % — ABNORMAL LOW (ref 43.0–77.0)
Platelets: 222 K/uL (ref 150.0–400.0)
RBC: 4.5 Mil/uL (ref 3.87–5.11)
RDW: 15.5 % (ref 11.5–15.5)
WBC: 4.6 K/uL (ref 4.0–10.5)

## 2023-09-21 LAB — LIPID PANEL
Cholesterol: 146 mg/dL (ref 0–200)
HDL: 62.9 mg/dL (ref >39.00)
LDL Cholesterol: 64 mg/dL (ref 0–99)
NonHDL: 82.95
Total CHOL/HDL Ratio: 2
Triglycerides: 96 mg/dL (ref 0.0–149.0)
VLDL: 19.2 mg/dL (ref 0.0–40.0)

## 2023-09-21 LAB — COMPREHENSIVE METABOLIC PANEL WITH GFR
ALT: 10 U/L (ref 0–35)
AST: 18 U/L (ref 0–37)
Albumin: 4 g/dL (ref 3.5–5.2)
Alkaline Phosphatase: 42 U/L (ref 39–117)
BUN: 11 mg/dL (ref 6–23)
CO2: 26 meq/L (ref 19–32)
Calcium: 9.1 mg/dL (ref 8.4–10.5)
Chloride: 105 meq/L (ref 96–112)
Creatinine, Ser: 0.84 mg/dL (ref 0.40–1.20)
GFR: 95.5 mL/min (ref >60.00)
Glucose, Bld: 91 mg/dL (ref 70–99)
Potassium: 3.6 meq/L (ref 3.5–5.1)
Sodium: 139 meq/L (ref 135–145)
Total Bilirubin: 0.4 mg/dL (ref 0.2–1.2)
Total Protein: 7 g/dL (ref 6.0–8.3)

## 2023-09-21 LAB — TSH: TSH: 2.66 u[IU]/mL (ref 0.35–5.50)

## 2023-09-21 LAB — MAGNESIUM: Magnesium: 1.9 mg/dL (ref 1.5–2.5)

## 2023-10-06 DIAGNOSIS — F4322 Adjustment disorder with anxiety: Secondary | ICD-10-CM | POA: Diagnosis not present

## 2023-10-08 ENCOUNTER — Ambulatory Visit: Admitting: Family Medicine

## 2023-10-08 ENCOUNTER — Encounter: Payer: Self-pay | Admitting: Family Medicine

## 2023-10-08 VITALS — BP 116/62 | HR 76 | Temp 98.2°F | Ht 68.0 in | Wt 133.0 lb

## 2023-10-08 DIAGNOSIS — F909 Attention-deficit hyperactivity disorder, unspecified type: Secondary | ICD-10-CM | POA: Diagnosis not present

## 2023-10-08 DIAGNOSIS — F339 Major depressive disorder, recurrent, unspecified: Secondary | ICD-10-CM

## 2023-10-08 NOTE — Patient Instructions (Signed)
-  It was nice to see you today. -Recommend a psychiatry referral for medication management for ADHD and depression. If you decide that you would like to go forth with this referral, please send a message through MyChart.  -Continue to follow cardiology.  -Follow up in 1 year for a physical.

## 2023-10-08 NOTE — Progress Notes (Signed)
   Established Patient Office Visit   Subjective:  Patient ID: Kimberly Wiley, female    DOB: 11/10/1995  Age: 28 y.o. MRN: [id number]  Chief Complaint  Patient presents with   Medical Management of Chronic Issues    1 month follow up     HPI Patient is following up on further concerns from initial visit.   She reports she has a history of depression and ADHD. She reports she was diagnosed with ADHD when she was [age] years old. Depression started around age 20 years old, about the same time she had to move [place] country. She has been on Concerta, caused nausea, dizziness, to be violet and Vyvanse worked better. Denies taking medication for depression. She reports she is curious to see if going back on medication would help anxiety and coping with getting a promotion at work that has more responsibility.   Based on chart review from [facility] in 2019, patient had suicidal thoughts sometimes. Prior about 1-1.5 years ago, she tried to overdose on Tylenol , but failed. She never had been admitted to the hospital for SI. She refused pharmacotherapeutic agents then.   ROS See HPI above     Objective:   BP 116/62   Pulse 76   Temp 98.2 F (36.8 C) (Oral)   Ht 5' 8 (1.727 m)   Wt 133 lb (60.3 kg)   LMP 08/16/2023 (Approximate)   SpO2 100%   BMI 20.22 kg/m    Physical Exam Vitals reviewed.  Constitutional:      General: She is not in acute distress.    Appearance: Normal appearance. She is not ill-appearing, toxic-appearing or diaphoretic.  Eyes:     General:        Right eye: No discharge.        Left eye: No discharge.     Conjunctiva/sclera: Conjunctivae normal.  Cardiovascular:     Rate and Rhythm: Normal rate.  Pulmonary:     Effort: Pulmonary effort is normal. No respiratory distress.  Musculoskeletal:        General: Normal range of motion.  Skin:    General: Skin is warm and dry.  Neurological:     General: No focal deficit present.     Mental Status: She is  alert and oriented to person, place, and time. Mental status is at baseline.  Psychiatric:        Mood and Affect: Mood normal.        Behavior: Behavior normal.        Thought Content: Thought content normal.        Judgment: Judgment normal.      Assessment & Plan:  Attention deficit hyperactivity disorder (ADHD), unspecified ADHD type  Recurrent major depressive disorder, remission status unspecified  -Recommend a psychiatry referral for medication management for ADHD and depression. She is currently being worked up for POTS with cardiology. She would like to hold on going through referral until she has been fully evaluated by cardiology. Advised if she decides that she would like to go forth with this referral, please send a message through MyChart.  -Continue to follow cardiology.  -Follow up in 1 year for a physical.   Return in about 1 year (around 10/07/2024) for physical.   Saurabh Hettich, NP

## 2023-10-09 ENCOUNTER — Encounter (HOSPITAL_COMMUNITY): Payer: Self-pay | Admitting: *Deleted

## 2023-10-12 DIAGNOSIS — Z01419 Encounter for gynecological examination (general) (routine) without abnormal findings: Secondary | ICD-10-CM | POA: Diagnosis not present

## 2023-10-12 DIAGNOSIS — Z113 Encounter for screening for infections with a predominantly sexual mode of transmission: Secondary | ICD-10-CM | POA: Diagnosis not present

## 2023-10-12 DIAGNOSIS — Z124 Encounter for screening for malignant neoplasm of cervix: Secondary | ICD-10-CM | POA: Diagnosis not present

## 2023-10-14 DIAGNOSIS — R079 Chest pain, unspecified: Secondary | ICD-10-CM | POA: Diagnosis not present

## 2023-10-14 DIAGNOSIS — R55 Syncope and collapse: Secondary | ICD-10-CM | POA: Diagnosis not present

## 2023-10-16 DIAGNOSIS — R079 Chest pain, unspecified: Secondary | ICD-10-CM

## 2023-10-16 DIAGNOSIS — R55 Syncope and collapse: Secondary | ICD-10-CM

## 2023-10-20 ENCOUNTER — Telehealth: Payer: Self-pay | Admitting: *Deleted

## 2023-10-20 ENCOUNTER — Telehealth (HOSPITAL_COMMUNITY): Payer: Self-pay

## 2023-10-20 DIAGNOSIS — F4322 Adjustment disorder with anxiety: Secondary | ICD-10-CM | POA: Diagnosis not present

## 2023-10-20 NOTE — Telephone Encounter (Signed)
 Pt returned my call for instructions for her stress test. She stated she would be here for here test. S.Brittany Amirault CCT

## 2023-10-20 NOTE — Telephone Encounter (Signed)
 Detailed instructions left on the patient's answering machine. Asked to call back with any questions.  S.Alessa Mazur CCT

## 2023-10-20 NOTE — Telephone Encounter (Signed)
 Copied from CRM #[id number]. Topic: General - Other >> Oct 20, 2023  4:14 PM Kimberly Wiley wrote: Reason for CRM: Patient called in regarding a missed a call from the office, would like a callback

## 2023-10-21 NOTE — Telephone Encounter (Signed)
 noted

## 2023-10-23 ENCOUNTER — Ambulatory Visit (HOSPITAL_COMMUNITY)
Admission: RE | Admit: 2023-10-23 | Discharge: 2023-10-23 | Disposition: A | Discharge status: Home / Self Care | Source: Ambulatory Visit | Attending: Physician Assistant | Admitting: Physician Assistant

## 2023-10-23 ENCOUNTER — Ambulatory Visit: Payer: Self-pay | Admitting: Physician Assistant

## 2023-10-23 ENCOUNTER — Ambulatory Visit (HOSPITAL_BASED_OUTPATIENT_CLINIC_OR_DEPARTMENT_OTHER)
Admission: RE | Admit: 2023-10-23 | Discharge: 2023-10-23 | Disposition: A | Discharge status: Home / Self Care | Source: Ambulatory Visit | Attending: Physician Assistant | Admitting: Physician Assistant

## 2023-10-23 DIAGNOSIS — R55 Syncope and collapse: Secondary | ICD-10-CM

## 2023-10-23 DIAGNOSIS — I358 Other nonrheumatic aortic valve disorders: Secondary | ICD-10-CM | POA: Insufficient documentation

## 2023-10-23 DIAGNOSIS — R072 Precordial pain: Secondary | ICD-10-CM | POA: Diagnosis present

## 2023-10-23 DIAGNOSIS — I700 Atherosclerosis of aorta: Secondary | ICD-10-CM | POA: Diagnosis not present

## 2023-10-23 LAB — EXERCISE TOLERANCE TEST
Angina Index: 0
Duke Treadmill Score: 11
Estimated workload: 12.5
Exercise duration (min): 10 min
Exercise duration (sec): 30 s
MPHR: 193 {beats}/min
Peak HR: 190 {beats}/min
Percent HR: 98 %
RPE: 18
Rest HR: 75 {beats}/min
ST Depression (mm): 0 mm

## 2023-10-23 LAB — ECHOCARDIOGRAM COMPLETE
Area-P 1/2: 5.09 cm2
S' Lateral: 2.5 cm

## 2023-10-27 ENCOUNTER — Encounter: Payer: Self-pay | Admitting: Physician Assistant

## 2023-11-03 DIAGNOSIS — F4322 Adjustment disorder with anxiety: Secondary | ICD-10-CM | POA: Diagnosis not present

## 2023-11-05 NOTE — Telephone Encounter (Signed)
 Spoke with pt regarding echo results. Pt verbalized understanding. Pt was advised to follow upas plan and to call our office if she has any further questions.

## 2023-12-01 DIAGNOSIS — F4322 Adjustment disorder with anxiety: Secondary | ICD-10-CM | POA: Diagnosis not present

## 2023-12-29 DIAGNOSIS — F4322 Adjustment disorder with anxiety: Secondary | ICD-10-CM | POA: Diagnosis not present
